# Patient Record
Sex: Female | Born: 2000
Health system: Southern US, Community
[De-identification: ages and names within clinical notes are randomized; demographics above are authoritative.]

## PROBLEM LIST (undated history)

## (undated) DIAGNOSIS — R55 Syncope and collapse: Secondary | ICD-10-CM

## (undated) HISTORY — DX: Syncope and collapse: R55

---
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
## 2020-02-06 ENCOUNTER — Encounter: Payer: Self-pay | Admitting: Family Medicine

## 2020-03-26 ENCOUNTER — Telehealth: Payer: Self-pay | Admitting: Family Medicine

## 2020-03-26 NOTE — Telephone Encounter (Signed)
Pt returned called for Tanya, I have informed Pt you will have to call her back that you was with a PT

## 2020-03-26 NOTE — Telephone Encounter (Signed)
Tried to contact patient. Unable to leave voicemail due to voicemail being full. Sent pt a Wellsite geologist.

## 2020-03-26 NOTE — Telephone Encounter (Signed)
Patient wanted your advise because she dropped one of her birthcontrol pill on the floor so she took the next one in line. She wanted to know if that would mess her up for as her period coming on this week. Please advise [phone]

## 2020-03-26 NOTE — Telephone Encounter (Signed)
Left message to return call 

## 2020-03-26 NOTE — Telephone Encounter (Signed)
Not likely to cause a problem.  I do not feel this will mess things up

## 2020-03-27 NOTE — Telephone Encounter (Signed)
Pt returned call and verbalized understanding  

## 2020-07-24 ENCOUNTER — Encounter: Payer: Self-pay | Admitting: Family Medicine

## 2020-07-25 ENCOUNTER — Other Ambulatory Visit: Payer: Self-pay | Admitting: *Deleted

## 2020-07-25 MED ORDER — ONDANSETRON HCL 8 MG PO TABS: 8.0000 mg | ORAL_TABLET | Freq: Three times a day (TID) | ORAL | 0 refills | Status: DC | PRN

## 2020-07-25 NOTE — Telephone Encounter (Signed)
Nurses Please connect with Tasnim. It would be fine for her to utilize Imodium as needed to slow up the diarrhea. She may also utilize Zofran 8 mg, 1 tablet taken 3 times daily as needed for nausea, #15 Should she start running fever having mucousy stools or significant pain she should be seen at a urgent care center or the student health care center. It is unlikely that the diarrhea is associated with her cycle. If she does not get back to normal within the next 3 days for show significant improvement over the next couple days she should be seen Thanks-Dr. Lorin Picket

## 2020-09-04 ENCOUNTER — Other Ambulatory Visit: Payer: Self-pay | Admitting: *Deleted

## 2020-09-04 ENCOUNTER — Encounter: Payer: Self-pay | Admitting: Family Medicine

## 2020-09-04 MED ORDER — NORETHINDRONE ACET-ETHINYL EST 1-20 MG-MCG PO TABS: 1.0000 | ORAL_TABLET | Freq: Every day | ORAL | 0 refills | Status: DC

## 2020-11-19 ENCOUNTER — Telehealth: Payer: Self-pay | Admitting: Family Medicine

## 2020-11-19 DIAGNOSIS — Z79899 Other long term (current) drug therapy: Secondary | ICD-10-CM

## 2020-11-19 DIAGNOSIS — Z13220 Encounter for screening for lipoid disorders: Secondary | ICD-10-CM

## 2020-11-19 DIAGNOSIS — Z0000 Encounter for general adult medical examination without abnormal findings: Secondary | ICD-10-CM

## 2020-11-19 DIAGNOSIS — Z131 Encounter for screening for diabetes mellitus: Secondary | ICD-10-CM

## 2020-11-19 NOTE — Telephone Encounter (Signed)
No labs in epic 

## 2020-11-19 NOTE — Telephone Encounter (Signed)
Given that she is young and healthy Typical blood work lipid, CBC, glucose Also CDC does recommend all adults get one-time testing for HIV antibody, and hepatitis C antibody Please review these recommendations with the patient if she consents go ahead and order

## 2020-11-19 NOTE — Telephone Encounter (Signed)
Patient has physical on 3/16 and needing labs done.

## 2020-11-20 NOTE — Telephone Encounter (Signed)
Pt replied via My chart and would like to opt out of Hep C and HIV. Pt would also like to opt out of vaginal exam since not sexually active. Lab orders placed.

## 2020-11-20 NOTE — Telephone Encounter (Signed)
Left message to return call and sent my chart message 

## 2020-11-26 DIAGNOSIS — Z79899 Other long term (current) drug therapy: Secondary | ICD-10-CM | POA: Diagnosis not present

## 2020-11-26 DIAGNOSIS — Z13220 Encounter for screening for lipoid disorders: Secondary | ICD-10-CM | POA: Diagnosis not present

## 2020-11-26 DIAGNOSIS — Z131 Encounter for screening for diabetes mellitus: Secondary | ICD-10-CM | POA: Diagnosis not present

## 2020-11-26 DIAGNOSIS — Z0000 Encounter for general adult medical examination without abnormal findings: Secondary | ICD-10-CM | POA: Diagnosis not present

## 2020-11-27 LAB — CBC WITH DIFFERENTIAL/PLATELET
Basophils Absolute: 0 10*3/uL (ref 0.0–0.2)
Basos: 1 %
EOS (ABSOLUTE): 0.2 10*3/uL (ref 0.0–0.4)
Eos: 2 %
Hematocrit: 41.5 % (ref 34.0–46.6)
Hemoglobin: 13.7 g/dL (ref 11.1–15.9)
Immature Grans (Abs): 0 10*3/uL (ref 0.0–0.1)
Immature Granulocytes: 0 %
Lymphocytes Absolute: 2.5 10*3/uL (ref 0.7–3.1)
Lymphs: 39 %
MCH: 27.4 pg (ref 26.6–33.0)
MCHC: 33 g/dL (ref 31.5–35.7)
MCV: 83 fL (ref 79–97)
Monocytes Absolute: 0.5 10*3/uL (ref 0.1–0.9)
Monocytes: 8 %
Neutrophils Absolute: 3.2 10*3/uL (ref 1.4–7.0)
Neutrophils: 50 %
Platelets: 199 10*3/uL (ref 150–450)
RBC: 5 x10E6/uL (ref 3.77–5.28)
RDW: 14.3 % (ref 11.7–15.4)
WBC: 6.4 10*3/uL (ref 3.4–10.8)

## 2020-11-27 LAB — LIPID PANEL
Chol/HDL Ratio: 2.4 ratio (ref 0.0–4.4)
Cholesterol, Total: 169 mg/dL (ref 100–169)
HDL: 70 mg/dL (ref >39)
LDL Chol Calc (NIH): 89 mg/dL (ref 0–109)
Triglycerides: 47 mg/dL (ref 0–89)
VLDL Cholesterol Cal: 10 mg/dL (ref 5–40)

## 2020-11-27 LAB — GLUCOSE, RANDOM: Glucose: 87 mg/dL (ref 65–99)

## 2020-11-28 ENCOUNTER — Ambulatory Visit (INDEPENDENT_AMBULATORY_CARE_PROVIDER_SITE_OTHER): Payer: BC Managed Care – PPO | Admitting: Family Medicine

## 2020-11-28 ENCOUNTER — Other Ambulatory Visit: Payer: Self-pay

## 2020-11-28 ENCOUNTER — Encounter: Payer: Self-pay | Admitting: Family Medicine

## 2020-11-28 VITALS — BP 118/74 | HR 96 | Temp 97.6°F | Ht 66.0 in | Wt 125.0 lb

## 2020-11-28 DIAGNOSIS — Z309 Encounter for contraceptive management, unspecified: Secondary | ICD-10-CM | POA: Diagnosis not present

## 2020-11-28 DIAGNOSIS — Z0000 Encounter for general adult medical examination without abnormal findings: Secondary | ICD-10-CM | POA: Diagnosis not present

## 2020-11-28 MED ORDER — NORETHINDRONE ACET-ETHINYL EST 1-20 MG-MCG PO TABS: 1.0000 | ORAL_TABLET | Freq: Every day | ORAL | 11 refills | Status: DC

## 2020-11-28 NOTE — Patient Instructions (Signed)
Preventive Care 21-20 Years Old, Female Preventive care refers to lifestyle choices and visits with your health care provider that can promote health and wellness. This includes:  A yearly physical exam. This is also called an annual wellness visit.  Regular dental and eye exams.  Immunizations.  Screening for certain conditions.  Healthy lifestyle choices, such as: ? Eating a healthy diet. ? Getting regular exercise. ? Not using drugs or products that contain nicotine and tobacco. ? Limiting alcohol use. What can I expect for my preventive care visit? Physical exam Your health care provider may check your:  Height and weight. These may be used to calculate your BMI (body mass index). BMI is a measurement that tells if you are at a healthy weight.  Heart rate and blood pressure.  Body temperature.  Skin for abnormal spots. Counseling Your health care provider may ask you questions about your:  Past medical problems.  Family's medical history.  Alcohol, tobacco, and drug use.  Emotional well-being.  Home life and relationship well-being.  Sexual activity.  Diet, exercise, and sleep habits.  Work and work environment.  Access to firearms.  Method of birth control.  Menstrual cycle.  Pregnancy history. What immunizations do I need? Vaccines are usually given at various ages, according to a schedule. Your health care provider will recommend vaccines for you based on your age, medical history, and lifestyle or other factors, such as travel or where you work.   What tests do I need? Blood tests  Lipid and cholesterol levels. These may be checked every 5 years starting at age 20.  Hepatitis C test.  Hepatitis B test. Screening  Diabetes screening. This is done by checking your blood sugar (glucose) after you have not eaten for a while (fasting).  STD (sexually transmitted disease) testing, if you are at risk.  BRCA-related cancer screening. This may be  done if you have a family history of breast, ovarian, tubal, or peritoneal cancers.  Pelvic exam and Pap test. This may be done every 3 years starting at age 21. Starting at age 30, this may be done every 5 years if you have a Pap test in combination with an HPV test. Talk with your health care provider about your test results, treatment options, and if necessary, the need for more tests.   Follow these instructions at home: Eating and drinking  Eat a healthy diet that includes fresh fruits and vegetables, whole grains, lean protein, and low-fat dairy products.  Take vitamin and mineral supplements as recommended by your health care provider.  Do not drink alcohol if: ? Your health care provider tells you not to drink. ? You are pregnant, may be pregnant, or are planning to become pregnant.  If you drink alcohol: ? Limit how much you have to 0-1 drink a day. ? Be aware of how much alcohol is in your drink. In the [place] one drink equals one 12 oz bottle of beer (355 mL), one 5 oz glass of wine (148 mL), or one 1 oz glass of hard liquor (44 mL).   Lifestyle  Take daily care of your teeth and gums. Brush your teeth every morning and night with fluoride toothpaste. Floss one time each day.  Stay active. Exercise for at least 30 minutes 5 or more days each week.  Do not use any products that contain nicotine or tobacco, such as cigarettes, e-cigarettes, and chewing tobacco. If you need help quitting, ask your health care provider.  Do not   use drugs.  If you are sexually active, practice safe sex. Use a condom or other form of protection to prevent STIs (sexually transmitted infections).  If you do not wish to become pregnant, use a form of birth control. If you plan to become pregnant, see your health care provider for a prepregnancy visit.  Find healthy ways to cope with stress, such as: ? Meditation, yoga, or listening to music. ? Journaling. ? Talking to a trusted  person. ? Spending time with friends and family. Safety  Always wear your seat belt while driving or riding in a vehicle.  Do not drive: ? If you have been drinking alcohol. Do not ride with someone who has been drinking. ? When you are tired or distracted. ? While texting.  Wear a helmet and other protective equipment during sports activities.  If you have firearms in your house, make sure you follow all gun safety procedures.  Seek help if you have been physically or sexually abused. What's next?  Go to your health care provider once a year for an annual wellness visit.  Ask your health care provider how often you should have your eyes and teeth checked.  Stay up to date on all vaccines. This information is not intended to replace advice given to you by your health care provider. Make sure you discuss any questions you have with your health care provider. Document Revised: 04/29/2020 Document Reviewed: 05/13/2018 Elsevier Patient Education  2021 [employer]  

## 2020-11-28 NOTE — Progress Notes (Signed)
Patient ID: Diana Chase, female    DOB: 08-Sep-2001, 20 y.o.   MRN: [id number]   Chief Complaint  Patient presents with  . Annual Exam   Subjective:  CC; wellness exam   Presents today for annual wellness.  No health concerns.  Reports that she feels mood swings right before her menstrual cycle.  Taking oral contraceptives.  Not sexually active.  Does not desire to have pelvic exam or breast exam today.  Denies fever, chills, chest pain, shortness of breath.  Denies calf pain.  Is a non-smoker.  Has a normal BMI.    The patient comes in today for a wellness visit.  A review of their health history was completed.  A review of medications was also completed.  Any needed refills; birth control   Eating habits: health conscious  Falls/  MVA accidents in past few months: none  Regular exercise: walking around campus, bike  Specialist pt sees on regular basis: none  Preventative health issues were discussed.   Additional concerns: blister on right foot for about one and a half weeks.    Medical History Diana Chase has a past medical history of Vasovagal syncope (08/2012).   Outpatient Encounter Medications as of 11/28/2020  Medication Sig  . [DISCONTINUED] norethindrone-ethinyl estradiol (LOESTRIN 1/20, 21,) 1-20 MG-MCG tablet Take 1 tablet by mouth daily.  . norethindrone-ethinyl estradiol (LOESTRIN 1/20, 21,) 1-20 MG-MCG tablet Take 1 tablet by mouth daily.  . [DISCONTINUED] ondansetron (ZOFRAN) 8 MG tablet Take 1 tablet (8 mg total) by mouth every 8 (eight) hours as needed for nausea or vomiting.   No facility-administered encounter medications on file as of 11/28/2020.     Review of Systems  Constitutional: Negative for chills and fever.  Eyes: Negative for visual disturbance.  Respiratory: Negative for cough and shortness of breath.   Cardiovascular: Negative for chest pain, palpitations and leg swelling.  Gastrointestinal: Negative for abdominal pain, blood in  stool, diarrhea, nausea and vomiting.  Genitourinary: Negative for dysuria.  Skin: Negative for rash.  Neurological: Negative for dizziness, light-headedness and headaches.  Psychiatric/Behavioral: Negative for sleep disturbance. The patient is not nervous/anxious.      Vitals BP 118/74   Pulse 96   Temp 97.6 F (36.4 C)   Ht 5\' 6"  (1.676 m)   Wt 125 lb (56.7 kg)   LMP 11/14/2020 (Approximate)   SpO2 99%   BMI 20.18 kg/m   Objective:   Physical Exam Vitals reviewed.  Constitutional:      Appearance: Normal appearance.  HENT:     Right Ear: Tympanic membrane normal.     Left Ear: Tympanic membrane normal.     Nose: Nose normal.  Eyes:     Extraocular Movements: Extraocular movements intact.  Cardiovascular:     Rate and Rhythm: Normal rate and regular rhythm.     Heart sounds: Normal heart sounds.  Pulmonary:     Effort: Pulmonary effort is normal.     Breath sounds: Normal breath sounds.  Chest:     Comments: Does not want breast exam. Abdominal:     General: Bowel sounds are normal.     Tenderness: There is no abdominal tenderness.  Genitourinary:    Comments: Pelvic exam deferred.  Musculoskeletal:        General: No swelling. Normal range of motion.     Comments: Denies calf pain.  Skin:    General: Skin is warm and dry.  Neurological:     General: No focal  deficit present.     Mental Status: She is alert.  Psychiatric:        Behavior: Behavior normal.      Assessment and Plan   1. Routine general medical examination at a health care facility - norethindrone-ethinyl estradiol (LOESTRIN 1/20, 21,) 1-20 MG-MCG tablet; Take 1 tablet by mouth daily.  Dispense: 28 tablet; Refill: 11  2. Encounter for contraceptive management, unspecified type - norethindrone-ethinyl estradiol (LOESTRIN 1/20, 21,) 1-20 MG-MCG tablet; Take 1 tablet by mouth daily.  Dispense: 28 tablet; Refill: 11   Impression: Well woman exam.  Health maintenance discussed, declines  HIV, hepatitis C, and HPV vaccine.  COVID vaccination discussed, not interested in pursuing Covid vaccination.  Refills sent for oral contraceptives.  Safety measures appropriate for age discussed: not sexually active, recommend condom use until STD testing with new partner (future recommendation).  Immunizations reviewed: has not gotten Covid vaccine, not interested.  Diet and exercise/ lifestyle modifications discussed: eats healthy and exercises daily.  Recommend 150 minutes per week of exercise such as walking. Recommend lots of fresh produce to include fruits, vegetables, beans, healthy fats such as avocado, nuts, seeds, and 3-6 ounces of protein at each meal.  Avoid fried foods and fast food. Limit alcohol consumption: no more than one drink per day for women and 2 drinks per day for men.  Stress management discussed: Routine vision and dental screening discussed: dentist every 6 months, recommend eye exam in next years.  Health maintenance: declines today.  Questions answered.   Dorena Bodo, NP 11/28/2020

## 2021-02-25 ENCOUNTER — Encounter: Payer: Self-pay | Admitting: Family Medicine

## 2021-09-02 ENCOUNTER — Encounter: Payer: Self-pay | Admitting: Family Medicine

## 2021-09-19 ENCOUNTER — Telehealth: Payer: Self-pay | Admitting: Family Medicine

## 2021-09-19 NOTE — Telephone Encounter (Signed)
Last labs completed on 11/27/20 glucose, cbc w/diff and lipid. Please advise. Thank you

## 2021-09-19 NOTE — Telephone Encounter (Signed)
Patient has physical on 3/17 and needing labs done

## 2021-09-20 NOTE — Telephone Encounter (Signed)
Patient informed of provider's message. Verbalized understanding.

## 2021-11-29 ENCOUNTER — Ambulatory Visit (INDEPENDENT_AMBULATORY_CARE_PROVIDER_SITE_OTHER): Payer: BC Managed Care – PPO | Admitting: Nurse Practitioner

## 2021-11-29 ENCOUNTER — Other Ambulatory Visit: Payer: Self-pay

## 2021-11-29 ENCOUNTER — Encounter: Payer: Self-pay | Admitting: Nurse Practitioner

## 2021-11-29 VITALS — BP 117/75 | HR 108 | Temp 98.4°F | Ht 65.5 in | Wt 128.0 lb

## 2021-11-29 DIAGNOSIS — Z309 Encounter for contraceptive management, unspecified: Secondary | ICD-10-CM | POA: Diagnosis not present

## 2021-11-29 DIAGNOSIS — Z01419 Encounter for gynecological examination (general) (routine) without abnormal findings: Secondary | ICD-10-CM | POA: Diagnosis not present

## 2021-11-29 MED ORDER — NORETHINDRONE ACET-ETHINYL EST 1-20 MG-MCG PO TABS: 1.0000 | ORAL_TABLET | Freq: Every day | ORAL | 3 refills | Status: DC

## 2021-11-29 NOTE — Progress Notes (Signed)
? ?Subjective:  ? ? Patient ID: Diana Chase, female    DOB: March 03, 2001, 20 y.o.   MRN: [id number] ? ?HPI ?Presents today for her annual physical. Rides [employer] bike 2-3 days a week for exercise and eats a well balanced diet. Non smoker, non alcoholic or vape. Not sexually active at this time, normal mensual flow lasting 3-4 days, denies vaginal itching discharge, dysuria, hematuria,frequency or urgency. Recommended vaccines and screening for age up to date except HPV.  Regular dental exams.  No other concerns today.  ? ?Review of Systems  ?Constitutional:  Negative for activity change, appetite change and fatigue.  ?HENT:  Negative for sore throat and trouble swallowing.   ?Respiratory:  Negative for cough, chest tightness, shortness of breath and wheezing.   ?Cardiovascular:  Negative for chest pain.  ?Gastrointestinal:  Negative for abdominal distention, abdominal pain, constipation, diarrhea, nausea and vomiting.  ?Genitourinary:  Negative for difficulty urinating, dysuria, enuresis, frequency, genital sores, menstrual problem, pelvic pain, urgency and vaginal discharge.  ?Depression screen [facility] 2/9 11/29/2021  ?Decreased Interest 0  ?Down, Depressed, Hopeless 0  ?PHQ - 2 Score 0  ?Altered sleeping -  ?Tired, decreased energy -  ?Change in appetite -  ?Feeling bad or failure about yourself  -  ?Trouble concentrating -  ?Moving slowly or fidgety/restless -  ?Suicidal thoughts -  ?PHQ-9 Score -  ?Difficult doing work/chores -  ? ? ?   ?Objective:  ? Physical Exam ?Constitutional:   ?   General: She is not in acute distress. ?   Appearance: Normal appearance.  ?Neck:  ?   Comments: Thyroid non tender to palpation.  ? ?  ?Cardiovascular:  ?   Rate and Rhythm: Normal rate and regular rhythm.  ?   Pulses: Normal pulses.  ?   Heart sounds: Normal heart sounds.  ?Pulmonary:  ?   Effort: Pulmonary effort is normal.  ?   Breath sounds: Normal breath sounds.  ?Abdominal:  ?   General: Abdomen is flat. There is no  distension.  ?   Palpations: Abdomen is soft. There is no mass.  ?   Tenderness: There is no abdominal tenderness.  ?Genitourinary: ?   Comments: Defers pelvic and breast exams. Denies any problems.  ?Musculoskeletal:  ?   Cervical back: Normal range of motion.  ?Lymphadenopathy:  ?   Cervical: No cervical adenopathy.  ?Neurological:  ?   Mental Status: She is alert and oriented to person, place, and time.  ?Psychiatric:     ?   Mood and Affect: Mood normal.     ?   Behavior: Behavior normal.     ?   Thought Content: Thought content normal.     ?   Judgment: Judgment normal.  ? ? ?Today's Vitals  ? 11/29/21 1038  ?BP: 117/75  ?Pulse: (!) 108  ?Temp: 98.4 ?F (36.9 ?C)  ?SpO2: 100%  ?Weight: 128 lb (58.1 kg)  ?Height: 5' 5.5" (1.664 m)  ? ?Body mass index is 20.98 kg/m?.  ?   ?Assessment & Plan:  ? ?Problem List Items Addressed This Visit   ? ?  ? Other  ? Routine general medical examination at a health care facility  ? Relevant Medications  ? norethindrone-ethinyl estradiol (LOESTRIN 1/20, 21,) 1-20 MG-MCG tablet  ? ?Other Visit Diagnoses   ? ? Encounter for contraceptive management, unspecified type      ? Relevant Medications  ? norethindrone-ethinyl estradiol (LOESTRIN 1/20, 21,) 1-20 MG-MCG tablet  ? ?  ?  ?  Meds ordered this encounter  ?Medications  ? norethindrone-ethinyl estradiol (LOESTRIN 1/20, 21,) 1-20 MG-MCG tablet  ?  Sig: Take 1 tablet by mouth daily.  ?  Dispense:  84 tablet  ?  Refill:  3  ?  Order Specific Question:   Supervising Provider  ?  Answer:   Lilyan Punt A [[id number]]  ?  ?Education on HPV vaccine provided. Patient defers at this time. ?Discussed the importance of stress reduction    ?Continue daily exercises and healthy eating habit.  ?Reviewed potential risks associated with oc use.  ?Return in about 1 year (around 11/30/2022) for physical. ? ? ?
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
## 2021-11-30 ENCOUNTER — Encounter: Payer: Self-pay | Admitting: Nurse Practitioner

## 2022-01-05 ENCOUNTER — Encounter: Payer: Self-pay | Admitting: Nurse Practitioner

## 2022-01-08 ENCOUNTER — Other Ambulatory Visit: Payer: Self-pay | Admitting: Family Medicine

## 2022-01-08 MED ORDER — DRYSOL 20 % EX SOLN: CUTANEOUS | 4 refills | Status: DC

## 2022-02-22 ENCOUNTER — Encounter: Payer: Self-pay | Admitting: Family Medicine

## 2022-06-27 ENCOUNTER — Other Ambulatory Visit: Payer: Self-pay | Admitting: Nurse Practitioner

## 2022-06-27 DIAGNOSIS — Z01419 Encounter for gynecological examination (general) (routine) without abnormal findings: Secondary | ICD-10-CM

## 2022-06-27 DIAGNOSIS — Z309 Encounter for contraceptive management, unspecified: Secondary | ICD-10-CM

## 2022-09-11 ENCOUNTER — Other Ambulatory Visit: Payer: Self-pay | Admitting: Nurse Practitioner

## 2022-09-11 DIAGNOSIS — Z01419 Encounter for gynecological examination (general) (routine) without abnormal findings: Secondary | ICD-10-CM

## 2022-09-11 DIAGNOSIS — Z309 Encounter for contraceptive management, unspecified: Secondary | ICD-10-CM

## 2022-09-12 MED ORDER — NORETHINDRONE ACET-ETHINYL EST 1-20 MG-MCG PO TABS: 1.0000 | ORAL_TABLET | Freq: Every day | ORAL | 0 refills | Status: DC

## 2022-12-08 ENCOUNTER — Other Ambulatory Visit: Payer: Self-pay | Admitting: Family Medicine

## 2022-12-08 DIAGNOSIS — Z309 Encounter for contraceptive management, unspecified: Secondary | ICD-10-CM

## 2022-12-08 DIAGNOSIS — Z01419 Encounter for gynecological examination (general) (routine) without abnormal findings: Secondary | ICD-10-CM

## 2022-12-09 ENCOUNTER — Other Ambulatory Visit: Payer: Self-pay | Admitting: Family Medicine

## 2022-12-09 DIAGNOSIS — Z309 Encounter for contraceptive management, unspecified: Secondary | ICD-10-CM

## 2022-12-09 DIAGNOSIS — Z01419 Encounter for gynecological examination (general) (routine) without abnormal findings: Secondary | ICD-10-CM

## 2023-01-23 ENCOUNTER — Ambulatory Visit (INDEPENDENT_AMBULATORY_CARE_PROVIDER_SITE_OTHER): Payer: BC Managed Care – PPO | Admitting: Nurse Practitioner

## 2023-01-23 ENCOUNTER — Encounter: Payer: Self-pay | Admitting: Nurse Practitioner

## 2023-01-23 VITALS — BP 103/68 | HR 100 | Temp 98.8°F | Ht 66.0 in | Wt 136.0 lb

## 2023-01-23 DIAGNOSIS — S80812A Abrasion, left lower leg, initial encounter: Secondary | ICD-10-CM | POA: Diagnosis not present

## 2023-01-23 DIAGNOSIS — Z01419 Encounter for gynecological examination (general) (routine) without abnormal findings: Secondary | ICD-10-CM

## 2023-01-23 DIAGNOSIS — Z23 Encounter for immunization: Secondary | ICD-10-CM

## 2023-01-23 DIAGNOSIS — F419 Anxiety disorder, unspecified: Secondary | ICD-10-CM

## 2023-01-23 DIAGNOSIS — Z309 Encounter for contraceptive management, unspecified: Secondary | ICD-10-CM | POA: Diagnosis not present

## 2023-01-23 DIAGNOSIS — Z0001 Encounter for general adult medical examination with abnormal findings: Secondary | ICD-10-CM

## 2023-01-23 MED ORDER — SERTRALINE HCL 50 MG PO TABS: ORAL_TABLET | ORAL | 0 refills | Status: DC

## 2023-01-23 MED ORDER — NORETHINDRONE ACET-ETHINYL EST 1-20 MG-MCG PO TABS: 1.0000 | ORAL_TABLET | Freq: Every day | ORAL | 3 refills | Status: DC

## 2023-01-23 NOTE — Progress Notes (Signed)
Subjective:    Patient ID: Diana Chase, female    DOB: [birth year], 22 y.o.   MRN: [id number]  HPI The patient comes in today for a wellness visit.    A review of their health history was completed.  A review of medications was also completed.  Any needed refills; oc's  Eating habits: overall good  Falls/  MVA accidents in past few months: None  Regular exercise: main activity is walking  Specialist pt sees on regular basis: None  Preventative health issues were discussed.   Additional concerns:  Pt reports some breast sensitivity, started two months ago, states symptoms stopped two days ago. Pt reports sleep schedule is troublesome. She is waking up 5-6 times nightly and has been for several years. Pt would also like to discuss anxiety issues.  Is any history of sexual activity.  Has never used tampons.  Regular cycles normal flow lasting 4 to 5 days.  Taking oral contraceptives to regulate her cycle. Denies visual issues.  Gets regular dental exams. Although her GAD-7 score is low patient states this is probably not quite accurate.  Has a chronic history of difficulty sleeping getting up several times a night.  States for many years she thought this was normal.  Describes herself as an anxious person but feels it is starting to get worse lately mainly due to stress in her life.  Denies any suicidal or homicidal thoughts or ideation.  Denies any self-harm behaviors.  Review of Systems  Constitutional:  Negative for activity change, appetite change and fatigue.  HENT:  Negative for sore throat and trouble swallowing.   Respiratory:  Negative for cough, chest tightness, shortness of breath and wheezing.   Cardiovascular:  Negative for chest pain.  Gastrointestinal:  Negative for abdominal distention, abdominal pain, constipation, diarrhea, nausea and vomiting.  Genitourinary:  Negative for difficulty urinating, dysuria, frequency, genital sores, menstrual problem, pelvic  pain, urgency and vaginal discharge.  Psychiatric/Behavioral:  Positive for sleep disturbance. Negative for self-injury and suicidal ideas. The patient is nervous/anxious.         01/23/2023    9:33 AM  Depression screen PHQ 2/9  Decreased Interest 0  Down, Depressed, Hopeless 1  PHQ - 2 Score 1  Altered sleeping 0  Tired, decreased energy 0  Change in appetite 0  Feeling bad or failure about yourself  0  Trouble concentrating 0  Moving slowly or fidgety/restless 0  Suicidal thoughts 0  PHQ-9 Score 1      01/23/2023    9:33 AM  GAD 7 : Generalized Anxiety Score  Nervous, Anxious, on Edge 2  Control/stop worrying 1  Worry too much - different things 1  Trouble relaxing 0  Restless 0  Easily annoyed or irritable 0  Afraid - awful might happen 0  Total GAD 7 Score 4        Objective:   Physical Exam Vitals and nursing note reviewed.  Constitutional:      General: She is not in acute distress.    Appearance: She is well-developed.  Neck:     Thyroid: No thyromegaly.     Trachea: No tracheal deviation.     Comments: Thyroid non tender to palpation. No mass or goiter noted.  Cardiovascular:     Rate and Rhythm: Normal rate and regular rhythm.     Heart sounds: Normal heart sounds. No murmur heard. Pulmonary:     Effort: Pulmonary effort is normal.     Breath sounds:  Normal breath sounds.  Chest:  Breasts:    Right: No swelling, inverted nipple, mass, nipple discharge, skin change or tenderness.     Left: No swelling, inverted nipple, mass, nipple discharge, skin change or tenderness.     Comments: Dense tissue noted bilaterally. Abdominal:     General: There is no distension.     Palpations: Abdomen is soft.     Tenderness: There is no abdominal tenderness.  Genitourinary:    Comments: Defers GU exam and Pap smear at this time.  With no history of intercourse or use of tampons, will hold at this time. Musculoskeletal:     Cervical back: Normal range of motion  and neck supple.  Lymphadenopathy:     Cervical: No cervical adenopathy.     Upper Body:     Right upper body: No supraclavicular, axillary or pectoral adenopathy.     Left upper body: No supraclavicular, axillary or pectoral adenopathy.  Skin:    General: Skin is warm and dry.     Findings: No rash.     Comments: A tiny open wound is noted on the left lower leg, no evidence of infection.  Tetanus status unknown.  Neurological:     Mental Status: She is alert and oriented to person, place, and time.  Psychiatric:        Mood and Affect: Mood normal.        Behavior: Behavior normal.        Thought Content: Thought content normal.        Judgment: Judgment normal.   Today's Vitals   01/23/23 0930  BP: 103/68  Pulse: 100  Temp: 98.8 F (37.1 C)  SpO2: 99%  Weight: 136 lb (61.7 kg)  Height: 5\' 6"  (1.676 m)   Body mass index is 21.95 kg/m.     Assessment & Plan:   Problem List Items Addressed This Visit       Other   Anxiety   Relevant Medications   sertraline (ZOLOFT) 50 MG tablet   Other Visit Diagnoses     Well woman exam    -  Primary   Relevant Medications   norethindrone-ethinyl estradiol (JUNEL 1/20) 1-20 MG-MCG tablet   Encounter for contraceptive management, unspecified type       Relevant Medications   norethindrone-ethinyl estradiol (JUNEL 1/20) 1-20 MG-MCG tablet   Abrasion, left lower leg, initial encounter       Need for vaccination       Relevant Orders   Tdap vaccine greater than or equal to 7yo IM (Completed)     Continue oral contraceptives for her cycle.  Reviewed safe sex issues.  Recommend HPV vaccine at local pharmacy.  Patient will consider. Tdap vaccine update today. Continue healthy habits including regular activity and healthy diet. Meds ordered this encounter  Medications   norethindrone-ethinyl estradiol (JUNEL 1/20) 1-20 MG-MCG tablet    Sig: Take 1 tablet by mouth daily.    Dispense:  84 tablet    Refill:  3    Order Specific  Question:   Supervising Provider    Answer:   Lilyan Punt A [[id number]]   sertraline (ZOLOFT) 50 MG tablet    Sig: Take 1/2 tab (25 mg) once daily for about 6 days; if tolerated increase to one tab (50 mg) daily    Dispense:  30 tablet    Refill:  0    Order Specific Question:   Supervising Provider    Answer:   Lilyan Punt  A [[id number]]   Reviewed potential adverse effects of sertraline including worsening depression/anxiety or suicidal thoughts or ideation.  Discontinue medication immediately if any problems. Plan to slowly titrate dose if tolerated and needed. Follow-up in 1 month for reassessment, call back sooner if any problems. Recommend physical in 1 year.

## 2023-01-23 NOTE — Patient Instructions (Signed)
Sertraline/Zoloft

## 2023-01-24 ENCOUNTER — Encounter: Payer: Self-pay | Admitting: Nurse Practitioner

## 2023-02-16 ENCOUNTER — Encounter: Payer: Self-pay | Admitting: Nurse Practitioner

## 2023-02-20 ENCOUNTER — Other Ambulatory Visit: Payer: Self-pay | Admitting: Nurse Practitioner

## 2023-02-20 DIAGNOSIS — F419 Anxiety disorder, unspecified: Secondary | ICD-10-CM

## 2023-02-20 MED ORDER — SERTRALINE HCL 50 MG PO TABS: ORAL_TABLET | ORAL | 1 refills | Status: DC

## 2023-03-31 ENCOUNTER — Encounter: Payer: Self-pay | Admitting: Nurse Practitioner

## 2023-04-02 ENCOUNTER — Telehealth: Payer: Self-pay

## 2023-04-02 ENCOUNTER — Encounter: Payer: Self-pay | Admitting: Family Medicine

## 2023-04-02 NOTE — Telephone Encounter (Signed)
Appointment scheduled.

## 2023-04-02 NOTE — Telephone Encounter (Signed)
Called pt to discuss the option of her coming in tomorrow 04/03/2023 @4 :30 if she was okay seeing a female provider. Pt is okay with this.

## 2023-04-03 ENCOUNTER — Ambulatory Visit: Payer: 59 | Admitting: Family Medicine

## 2023-04-03 ENCOUNTER — Encounter: Payer: Self-pay | Admitting: Family Medicine

## 2023-04-03 DIAGNOSIS — N758 Other diseases of Bartholin's gland: Secondary | ICD-10-CM

## 2023-04-03 MED ORDER — SULFAMETHOXAZOLE-TRIMETHOPRIM 800-160 MG PO TABS: 1.0000 | ORAL_TABLET | Freq: Two times a day (BID) | ORAL | 0 refills | Status: DC

## 2023-04-03 NOTE — Patient Instructions (Signed)
Warm soaks.  Antibiotic as prescribed.  Call with concerns.  Take care  Dr. Adriana Simas

## 2023-04-03 NOTE — Progress Notes (Unsigned)
   Subjective:  Patient ID: Diana Chase, female    DOB: 2001/03/09  Age: 22 y.o. MRN: [id number]  CC: Chief Complaint  Patient presents with   Cyst    Pain and cyst on vulva    HPI:  22 year old female presents for evaluation of the above.    Patient Active Problem List   Diagnosis Date Noted   Anxiety 01/23/2023    Social Hx   Social History   Socioeconomic History   Marital status: Single    Spouse name: Not on file   Number of children: Not on file   Years of education: Not on file   Highest education level: Not on file  Occupational History   Not on file  Tobacco Use   Smoking status: Never   Smokeless tobacco: Never  Vaping Use   Vaping status: Never Used  Substance and Sexual Activity   Alcohol use: Never   Drug use: Never   Sexual activity: Never    Birth control/protection: Abstinence  Other Topics Concern   Not on file  Social History Narrative   Not on file   Social Determinants of Health   Financial Resource Strain: Not on file  Food Insecurity: Not on file  Transportation Needs: Not on file  Physical Activity: Not on file  Stress: Not on file  Social Connections: Not on file    Review of Systems Per HPI  Objective:  Ht 5\' 6"  (1.676 m)   BMI 21.95 kg/m      01/23/2023    9:30 AM 11/29/2021   10:38 AM 11/28/2020   10:26 AM  BP/Weight  Systolic BP 103 117 118  Diastolic BP 68 75 74  Wt. (Lbs) 136 128 125  BMI 21.95 kg/m2 20.98 kg/m2 20.18 kg/m2    Physical Exam  Lab Results  Component Value Date   WBC 6.4 11/26/2020   HGB 13.7 11/26/2020   HCT 41.5 11/26/2020   PLT 199 11/26/2020   GLUCOSE 87 11/26/2020   CHOL 169 11/26/2020   TRIG 47 11/26/2020   HDL 70 11/26/2020   LDLCALC 89 11/26/2020     Assessment & Plan:   Problem List Items Addressed This Visit   None   No orders of the defined types were placed in this encounter.   Follow-up:  No follow-ups on file.  Everlene Other DO [facility]

## 2023-04-05 DIAGNOSIS — N758 Other diseases of Bartholin's gland: Secondary | ICD-10-CM | POA: Insufficient documentation

## 2023-04-05 NOTE — Assessment & Plan Note (Addendum)
Advise warm soaks.  Bactrim as directed.

## 2023-04-13 ENCOUNTER — Ambulatory Visit: Payer: BC Managed Care – PPO | Admitting: Family Medicine

## 2023-06-27 ENCOUNTER — Ambulatory Visit
Admission: EM | Admit: 2023-06-27 | Discharge: 2023-06-27 | Disposition: A | Discharge status: Home / Self Care | Payer: 59 | Attending: Nurse Practitioner | Admitting: Nurse Practitioner

## 2023-06-27 ENCOUNTER — Encounter: Payer: Self-pay | Admitting: Emergency Medicine

## 2023-06-27 DIAGNOSIS — N758 Other diseases of Bartholin's gland: Secondary | ICD-10-CM | POA: Diagnosis not present

## 2023-06-27 MED ORDER — SULFAMETHOXAZOLE-TRIMETHOPRIM 800-160 MG PO TABS: 1.0000 | ORAL_TABLET | Freq: Two times a day (BID) | ORAL | 0 refills | Status: AC

## 2023-06-27 NOTE — Discharge Instructions (Signed)
Take medication as prescribed. May take over-the-counter Tylenol or ibuprofen as needed for pain, fever, general discomfort. Apply warm compresses to the affected area 3-4 times daily as needed. If the area begins to drain, you may use a maxi pad, or panty liner as needed for drainage. Warm Epsom salt soaks as needed for vaginal pain or discomfort. Follow-up in this clinic or with your primary care physician if you experience worsening pain, redness, or other concerns. Go to the emergency department immediately if you experience worsening symptoms with fever, chills, chest pain, abdominal pain, nausea, vomiting, or diarrhea. Follow-up as needed.

## 2023-06-27 NOTE — ED Triage Notes (Signed)
States she has a swollen gland around vaginal area.  Has had a history of this.  Symptoms started again yesterday.  Pain and swelling and pressure on right side of vaginal area.

## 2023-06-27 NOTE — ED Provider Notes (Signed)
[facility]    CSN: [id number] Arrival date & time: 06/27/23  4098      History   Chief Complaint No chief complaint on file.   HPI Diana Chase is a 22 y.o. female.   The history is provided by the patient.   Patient reports that she has had a tender area on the right side of her vulva that started over the past several days.. Mild to moderate pain. Seems to be worse with certain positions. She reports vaginal discharge but states that this is normal for her. She is not sexually active. No relieving factors. No other associated symptoms. No other complaints.  Patient reports she experienced the same or similar symptoms in July, and was diagnosed with a Bartholin cyst.  She states she was provided an antibiotic which helped relieve her symptoms.  Patient denies fever, chills, abdominal pain, nausea, vomiting, vaginal discharge, vaginal odor, or vaginal itching.  Past Medical History:  Diagnosis Date   Vasovagal syncope 08/2012   orthostatic vasovagal syncope    Patient Active Problem List   Diagnosis Date Noted   Bartholin's gland infection 04/05/2023   Anxiety 01/23/2023    History reviewed. No pertinent surgical history.  OB History   No obstetric history on file.      Home Medications    Prior to Admission medications   Medication Sig Start Date End Date Taking? Authorizing Provider  sulfamethoxazole-trimethoprim (BACTRIM DS) 800-160 MG tablet Take 1 tablet by mouth 2 (two) times daily for 7 days. 06/27/23 07/04/23 Yes Betty Brooks-Warren, Sadie Haber, NP  norethindrone-ethinyl estradiol (JUNEL 1/20) 1-20 MG-MCG tablet Take 1 tablet by mouth daily. 01/23/23   Campbell Riches, NP  sertraline (ZOLOFT) 50 MG tablet Take 1/2 tab (25 mg) once daily for about 6 days; if tolerated increase to one tab (50 mg) daily 02/20/23   Campbell Riches, NP    Family History Family History  Problem Relation Age of Onset   Cancer Maternal Grandmother        colon    Diabetes Paternal Grandfather     Social History Social History   Tobacco Use   Smoking status: Never   Smokeless tobacco: Never  Vaping Use   Vaping status: Never Used  Substance Use Topics   Alcohol use: Never   Drug use: Never     Allergies   Patient has no known allergies.   Review of Systems Review of Systems Per HPI  Physical Exam Triage Vital Signs ED Triage Vitals  Encounter Vitals Group     BP 06/27/23 0819 112/77     Systolic BP Percentile --      Diastolic BP Percentile --      Pulse Rate 06/27/23 0819 (!) 106     Resp 06/27/23 0819 18     Temp 06/27/23 0819 98 F (36.7 C)     Temp Source 06/27/23 0819 Oral     SpO2 06/27/23 0819 97 %     Weight --      Height --      Head Circumference --      Peak Flow --      Pain Score 06/27/23 0820 4     Pain Loc --      Pain Education --      Exclude from Growth Chart --    No data found.  Updated Vital Signs BP 112/77 (BP Location: Right Arm)   Pulse (!) 106   Temp 98 F (36.7  C) (Oral)   Resp 18   LMP 06/13/2023 (Approximate)   SpO2 97%   Visual Acuity Right Eye Distance:   Left Eye Distance:   Bilateral Distance:    Right Eye Near:   Left Eye Near:    Bilateral Near:     Physical Exam Vitals and nursing note reviewed. Exam conducted with a chaperone present (Meg, RT).  Constitutional:      General: She is not in acute distress.    Appearance: Normal appearance.  HENT:     Head: Normocephalic.  Eyes:     Extraocular Movements: Extraocular movements intact.     Pupils: Pupils are equal, round, and reactive to light.  Pulmonary:     Effort: Pulmonary effort is normal.  Genitourinary:    Exam position: Supine.     Pubic Area: No rash.      Labia:        Right: Tenderness present. No lesion or injury.     Musculoskeletal:     Cervical back: Normal range of motion.  Lymphadenopathy:     Cervical: No cervical adenopathy.  Skin:    General: Skin is warm and dry.  Neurological:      General: No focal deficit present.     Mental Status: She is alert and oriented to person, place, and time.  Psychiatric:        Mood and Affect: Mood normal.        Behavior: Behavior normal.      UC Treatments / Results  Labs (all labs ordered are listed, but only abnormal results are displayed) Labs Reviewed - No data to display  EKG   Radiology No results found.  Procedures Procedures (including critical care time)  Medications Ordered in UC Medications - No data to display  Initial Impression / Assessment and Plan / UC Course  I have reviewed the triage vital signs and the nursing notes.  Pertinent labs & imaging results that were available during my care of the patient were reviewed by me and considered in my medical decision making (see chart for details).  The patient is well-appearing, she is in no acute distress, vital signs are stable.  Symptoms consistent with a Bartholin gland infection.  Will treat patient with Bactrim DS 800/160 mg for the next 7 days.  Supportive care recommendations were provided and discussed with the patient to include over-the-counter analgesics, warm compresses to the affected area, and warm Epsom salt soaks as needed for vaginal pain or discomfort.  Patient was given strict indications of when follow-up be necessary.  Patient is in agreement with this plan of care and verbalizes understanding.  All questions were answered.  Patient stable for discharge.  Final Clinical Impressions(s) / UC Diagnoses   Final diagnoses:  Bartholin's gland infection     Discharge Instructions      Take medication as prescribed. May take over-the-counter Tylenol or ibuprofen as needed for pain, fever, general discomfort. Apply warm compresses to the affected area 3-4 times daily as needed. If the area begins to drain, you may use a maxi pad, or panty liner as needed for drainage. Warm Epsom salt soaks as needed for vaginal pain or  discomfort. Follow-up in this clinic or with your primary care physician if you experience worsening pain, redness, or other concerns. Go to the emergency department immediately if you experience worsening symptoms with fever, chills, chest pain, abdominal pain, nausea, vomiting, or diarrhea. Follow-up as needed.     ED Prescriptions  Medication Sig Dispense Auth. Provider   sulfamethoxazole-trimethoprim (BACTRIM DS) 800-160 MG tablet Take 1 tablet by mouth 2 (two) times daily for 7 days. 14 tablet Chance Karam-Warren, Sadie Haber, NP      PDMP not reviewed this encounter.   Abran Cantor, NP 06/27/23 [phone]

## 2023-06-29 ENCOUNTER — Ambulatory Visit: Payer: 59 | Admitting: Nurse Practitioner

## 2023-07-05 ENCOUNTER — Encounter: Payer: Self-pay | Admitting: Nurse Practitioner

## 2023-07-06 ENCOUNTER — Other Ambulatory Visit: Payer: Self-pay | Admitting: Nurse Practitioner

## 2023-07-06 DIAGNOSIS — N758 Other diseases of Bartholin's gland: Secondary | ICD-10-CM

## 2023-07-06 MED ORDER — SULFAMETHOXAZOLE-TRIMETHOPRIM 800-160 MG PO TABS: 1.0000 | ORAL_TABLET | Freq: Two times a day (BID) | ORAL | 2 refills | Status: DC

## 2023-07-09 ENCOUNTER — Other Ambulatory Visit: Payer: Self-pay | Admitting: Nurse Practitioner

## 2023-07-09 DIAGNOSIS — N750 Cyst of Bartholin's gland: Secondary | ICD-10-CM

## 2023-08-10 ENCOUNTER — Ambulatory Visit (INDEPENDENT_AMBULATORY_CARE_PROVIDER_SITE_OTHER): Payer: 59 | Admitting: Obstetrics & Gynecology

## 2023-08-10 ENCOUNTER — Encounter: Payer: Self-pay | Admitting: Obstetrics & Gynecology

## 2023-08-10 VITALS — BP 114/62 | HR 116 | Ht 66.0 in | Wt 143.0 lb

## 2023-08-10 DIAGNOSIS — N907 Vulvar cyst: Secondary | ICD-10-CM

## 2023-08-10 MED ORDER — DOXYCYCLINE HYCLATE 100 MG PO TABS: 100.0000 mg | ORAL_TABLET | Freq: Two times a day (BID) | ORAL | 0 refills | Status: AC

## 2023-08-10 NOTE — Progress Notes (Signed)
   GYN VISIT Patient name: Diana Chase MRN [id number]  Date of birth: [birth year] Chief Complaint:   Bartholin's Cyst  History of Present Illness:   Diana Chase is a 22 y.o. G0P0000  female being seen today for the following concerns:  Bartholin's cyst?: Started in July- took Bactrim- then came back 3 mos. took another round of Bactrim and it resolved.  She reports that she feels like it is starting to return- notes some discomfort and swelling.  It does not require her to take medication.  Using warm compresses/Sitz bath.  Denies fever or chills.  On COCs- to help regulate her menses. Last for 5 days and changes a pad every 1-2 hours, which is more tolerable than previously.  Also notes a persistent clear discharge that requires her to wear a small panty liner every day.  She has been doing this for many years.  Denies vaginal itching or odor.  Denies pelvic or abdominal pain.  Denies intermenstrual bleeding  Patient's last menstrual period was 08/04/2023.    Review of Systems:   Pertinent items are noted in HPI Denies fever/chills, dizziness, headaches, visual disturbances, fatigue, shortness of breath, chest pain, abdominal pain, vomiting, no problems with periods, bowel movements, urination, or intercourse unless otherwise stated above.  Pertinent History Reviewed:  History reviewed. No pertinent surgical history.  Past Medical History:  Diagnosis Date   Vasovagal syncope 08/2012   orthostatic vasovagal syncope   Reviewed problem list, medications and allergies. Physical Assessment:   Vitals:   08/10/23 1008  BP: 114/62  Pulse: (!) 116  Weight: 143 lb (64.9 kg)  Height: 5\' 6"  (1.676 m)  Body mass index is 23.08 kg/m.       Physical Examination:   General appearance: alert, well appearing, and in no distress  Psych: mood appropriate, normal affect  Skin: warm & dry   Cardiovascular: normal heart rate noted  Respiratory: normal respiratory effort, no  distress  Abdomen: soft, non-tender   Pelvic: VULVA:  normal labia majora, normal Bartholin's glands bialterally.  Slightly tender, enlarged right upper vulva.  No other abnormalities noted.  Declined internal exam.    Extremities: no edema   Chaperone:  Dr. Elberta Fortis     Assessment & Plan:  1) Vulvar cyst -based on location- not Bartholin's cyst -does not require I&D a this time -plan for prolonged antibiotics with close follow up  2) Vaginal discharge -does not sound infection -discussed concern that environmental factors- may be contributing to the discharge []  may consider vaginitis panel at next visit  Meds ordered this encounter  Medications   doxycycline (VIBRA-TABS) 100 MG tablet    Sig: Take 1 tablet (100 mg total) by mouth 2 (two) times daily for 14 days.    Dispense:  28 tablet    Refill:  0      Return in about 2 months (around 10/10/2023) for follow up with Dr. Charlotta Newton.   Myna Hidalgo, DO Attending Obstetrician & Gynecologist, [facility] for [employer], [facility]

## 2023-09-01 ENCOUNTER — Other Ambulatory Visit: Payer: Self-pay | Admitting: Nurse Practitioner

## 2023-09-01 ENCOUNTER — Encounter: Payer: Self-pay | Admitting: Nurse Practitioner

## 2023-09-01 DIAGNOSIS — F419 Anxiety disorder, unspecified: Secondary | ICD-10-CM

## 2023-09-01 MED ORDER — SERTRALINE HCL 50 MG PO TABS: ORAL_TABLET | ORAL | 1 refills | Status: DC

## 2023-09-21 ENCOUNTER — Ambulatory Visit: Payer: 59 | Admitting: Obstetrics & Gynecology

## 2023-10-07 ENCOUNTER — Ambulatory Visit: Payer: 59 | Admitting: Obstetrics & Gynecology

## 2023-10-29 ENCOUNTER — Encounter: Payer: Self-pay | Admitting: Nurse Practitioner

## 2023-10-29 ENCOUNTER — Other Ambulatory Visit: Payer: Self-pay

## 2023-10-29 DIAGNOSIS — Z309 Encounter for contraceptive management, unspecified: Secondary | ICD-10-CM

## 2023-10-29 DIAGNOSIS — Z01419 Encounter for gynecological examination (general) (routine) without abnormal findings: Secondary | ICD-10-CM

## 2023-10-29 MED ORDER — NORETHINDRONE ACET-ETHINYL EST 1-20 MG-MCG PO TABS: 1.0000 | ORAL_TABLET | Freq: Every day | ORAL | 3 refills | Status: DC

## 2023-11-27 ENCOUNTER — Encounter: Payer: Self-pay | Admitting: Nurse Practitioner

## 2024-01-19 ENCOUNTER — Encounter: Payer: Self-pay | Admitting: Nurse Practitioner

## 2024-02-15 ENCOUNTER — Ambulatory Visit: Admitting: Family Medicine

## 2024-02-15 DIAGNOSIS — Z309 Encounter for contraceptive management, unspecified: Secondary | ICD-10-CM

## 2024-02-15 DIAGNOSIS — F419 Anxiety disorder, unspecified: Secondary | ICD-10-CM | POA: Diagnosis not present

## 2024-02-15 MED ORDER — NORETHINDRONE ACET-ETHINYL EST 1-20 MG-MCG PO TABS: 1.0000 | ORAL_TABLET | Freq: Every day | ORAL | 3 refills | Status: AC

## 2024-02-15 MED ORDER — SERTRALINE HCL 50 MG PO TABS: ORAL_TABLET | ORAL | 3 refills | Status: AC

## 2024-02-15 NOTE — Progress Notes (Signed)
   Subjective:    Patient ID: Diana Chase, female    DOB: [birth year], 23 y.o.   MRN: [id number]  HPI Pt comes in today for refill on all er medications no additional questions or concerns. Medications and allergies reviewed.   Here today for follow-up States overall she is feeling well Denies any major setbacks She states the Zoloft  is greatly helping her by managing stress and anxiousness.  She no longer has a sense of doom She has graduated [employer] affairs [employer] school She is currently working part-time She feels she is doing well  She had heavy cycles with heavy bleeding and she is now doing much better on birth control pills she does not smoke she is not sexually active  Review of Systems     Objective:   Physical Exam  Lungs clear heart regular overall behavior good      Assessment & Plan:  Anxiety/stress doing well continue current measures Heavy cycles doing very well on birth control Patient was told after she asked-that it would be safe for her to continue Zoloft  and weight down the road if she is interested in tapering off of this as long as she has communication with us  we would cut it down to 25 mg daily for approximately 2 weeks then stop obviously if she restarted having trouble we would have to move back onto Zoloft  but for now she would like to continue it She is stable 1 year refill given she was instructed if she feels things are worse or problematic to follow-up immediately  As for the cycles she does not smoke she would like to continue this 1 year refill given recommend follow-up in 1 years time She may follow-up sooner if any problems Lab work not indicated currently consider lab work every 3 to 4 years

## 2024-06-20 ENCOUNTER — Ambulatory Visit (INDEPENDENT_AMBULATORY_CARE_PROVIDER_SITE_OTHER): Admitting: Family Medicine

## 2024-06-20 VITALS — BP 106/75 | HR 107 | Ht 66.0 in | Wt 162.0 lb

## 2024-06-20 DIAGNOSIS — R0981 Nasal congestion: Secondary | ICD-10-CM

## 2024-06-20 DIAGNOSIS — J301 Allergic rhinitis due to pollen: Secondary | ICD-10-CM

## 2024-06-20 DIAGNOSIS — L42 Pityriasis rosea: Secondary | ICD-10-CM

## 2024-06-20 MED ORDER — FLUTICASONE PROPIONATE 50 MCG/ACT NA SUSP: 2.0000 | Freq: Every day | NASAL | 6 refills | Status: AC

## 2024-06-20 NOTE — Progress Notes (Addendum)
   Subjective:    Patient ID: Diana Chase, female    DOB: [birth year], 23 y.o.   MRN: [id number]  HPI  Rash under right arm side area, to under right breast area Lower abd - small red no itching or any other symptoms Has spread in the past 2 weeks She states slight itchiness but nothing bad No pustules or drainage  Pityriasis rosea  Patient also with persistent nasal congestion sometimes drainage but most times she is stuffy and cannot breathe through her nose uses OTC allergy medicine does not really seem to help a whole lot this has been off and on this spring and summer Review of Systems     Objective:   Physical Exam She has a herald patch on the right axilla area she has some linear markings on the abdomen and some on the back consistent with pityriasis rosea Nasal turbinates appear normal no deviated septum      Assessment & Plan:  Pityriasis rosea Self-limiting Should gradually get better over the next 8 to 12 weeks May use antihistamines when necessary or hydrocortisone cream when necessary No need to utilize acyclovir currently  Allergic rhinitis-recommend Flonase allergy spray along with oral antihistamine if that does not do well enough to get things better to let us  know may or may not need referral to ENT if persistent troubles

## 2024-09-21 ENCOUNTER — Encounter: Payer: Self-pay | Admitting: Family Medicine
